# Patient Record
Sex: Male | Born: 1990 | Race: Black or African American | Hispanic: No | Marital: Single | Smoking: Current some day smoker
Health system: Southern US, Community
[De-identification: ages and names within clinical notes are randomized; demographics above are authoritative.]

---

## 2012-10-19 ENCOUNTER — Encounter (HOSPITAL_BASED_OUTPATIENT_CLINIC_OR_DEPARTMENT_OTHER): Payer: Self-pay

## 2012-10-19 ENCOUNTER — Emergency Department (HOSPITAL_BASED_OUTPATIENT_CLINIC_OR_DEPARTMENT_OTHER)
Admission: EM | Admit: 2012-10-19 | Discharge: 2012-10-19 | Disposition: A | Discharge status: Home / Self Care | Payer: Self-pay | Attending: Emergency Medicine | Admitting: Emergency Medicine

## 2012-10-19 DIAGNOSIS — L700 Acne vulgaris: Secondary | ICD-10-CM

## 2012-10-19 DIAGNOSIS — F17200 Nicotine dependence, unspecified, uncomplicated: Secondary | ICD-10-CM | POA: Insufficient documentation

## 2012-10-19 DIAGNOSIS — R220 Localized swelling, mass and lump, head: Secondary | ICD-10-CM | POA: Insufficient documentation

## 2012-10-19 DIAGNOSIS — L708 Other acne: Secondary | ICD-10-CM | POA: Insufficient documentation

## 2012-10-19 DIAGNOSIS — R21 Rash and other nonspecific skin eruption: Secondary | ICD-10-CM | POA: Insufficient documentation

## 2012-10-19 MED ORDER — SULFAMETHOXAZOLE-TRIMETHOPRIM 800-160 MG PO TABS: 1.0000 | ORAL_TABLET | Freq: Two times a day (BID) | ORAL | Status: AC

## 2012-10-19 MED ORDER — CEPHALEXIN 500 MG PO CAPS: 500.0000 mg | ORAL_CAPSULE | Freq: Four times a day (QID) | ORAL | Status: AC

## 2012-10-19 NOTE — ED Notes (Signed)
Multiple abscesses to face since march 1

## 2012-10-19 NOTE — ED Provider Notes (Signed)
History     CSN: [id number]  Arrival date & time 10/19/12  1826   First MD Initiated Contact with Patient 10/19/12 2104      Chief Complaint  Patient presents with  . Abscess    (Consider location/radiation/quality/duration/timing/severity/associated sxs/prior treatment) HPI Paul Andrews is a 22 y.o. male who presents to ED with complaint large "pimples" to the face. States history of acne, but never this large. Some are tender. NO drainage. Has tried clearasil with no improvment. States no fever, chills, malaise. Nothing making it better. No hx of the same. Symptoms began about a week ago. No new products to the face.    History reviewed. No pertinent past medical history.  History reviewed. No pertinent past surgical history.  No family history on file.  History  Substance Use Topics  . Smoking status: Current Every Day Smoker  . Smokeless tobacco: Not on file  . Alcohol Use: Yes      Review of Systems  Constitutional: Negative for fever and chills.  HENT: Positive for facial swelling.   Skin: Positive for rash and wound.    Allergies  Review of patient's allergies indicates no known allergies.  Home Medications  No current outpatient prescriptions on file.  BP 133/58  Pulse 55  Temp(Src) 98.2 F (36.8 C) (Oral)  Resp 16  Ht 6\' 1"  (1.854 m)  Wt 210 lb (95.255 kg)  BMI 27.71 kg/m2  SpO2 99%  Physical Exam  Vitals reviewed. Constitutional: He appears well-developed and well-nourished. No distress.  HENT:  Head: Normocephalic and atraumatic.  Multiple cystic acne vs furuncles to the bilateral face with largest one about 1cm in diameter to the right lower face. One in the left axilla, <1cm. All non tender. No drainage.   Neurological: He is alert.  Skin: Skin is warm and dry.    ED Course  Procedures (including critical care time)  Labs Reviewed - No data to display No results found.  INCISION AND DRAINAGE Performed by: Jaynie Crumble  A Consent: Verbal consent obtained. Risks and benefits: risks, benefits and alternatives were discussed Type: abscess  Body area: right lower cheek  Anesthesia: local infiltration  needdle aspiration  No anesthetic  Drainage: purulent  Drainage amount: moderate   Patient tolerance: Patient tolerated the procedure well with no immediate complications.     1. Cystic acne       MDM  Pt with multiple furuncles/ cystic acne. The large one to the right cheek aspirated with need with moderate purulent drainage. Will start on antibiotics. Warm compresses. Follow up with pcp.        Lottie Mussel, PA-C 10/20/12 0202

## 2012-10-21 NOTE — ED Provider Notes (Signed)
Medical screening examination/treatment/procedure(s) were performed by non-physician practitioner and as supervising physician I was immediately available for consultation/collaboration.  Hurman Horn, MD 10/21/12 2214

## 2015-09-22 ENCOUNTER — Emergency Department (HOSPITAL_COMMUNITY): Payer: BLUE CROSS/BLUE SHIELD

## 2015-09-22 ENCOUNTER — Encounter (HOSPITAL_COMMUNITY): Payer: Self-pay | Admitting: Emergency Medicine

## 2015-09-22 ENCOUNTER — Emergency Department (HOSPITAL_COMMUNITY)
Admission: EM | Admit: 2015-09-22 | Discharge: 2015-09-22 | Disposition: A | Discharge status: Home / Self Care | Payer: BLUE CROSS/BLUE SHIELD | Attending: Emergency Medicine | Admitting: Emergency Medicine

## 2015-09-22 DIAGNOSIS — Y9389 Activity, other specified: Secondary | ICD-10-CM | POA: Diagnosis not present

## 2015-09-22 DIAGNOSIS — Y990 Civilian activity done for income or pay: Secondary | ICD-10-CM | POA: Diagnosis not present

## 2015-09-22 DIAGNOSIS — W208XXA Other cause of strike by thrown, projected or falling object, initial encounter: Secondary | ICD-10-CM | POA: Insufficient documentation

## 2015-09-22 DIAGNOSIS — S92514A Nondisplaced fracture of proximal phalanx of right lesser toe(s), initial encounter for closed fracture: Secondary | ICD-10-CM | POA: Diagnosis not present

## 2015-09-22 DIAGNOSIS — S92901A Unspecified fracture of right foot, initial encounter for closed fracture: Secondary | ICD-10-CM

## 2015-09-22 DIAGNOSIS — Y9289 Other specified places as the place of occurrence of the external cause: Secondary | ICD-10-CM | POA: Insufficient documentation

## 2015-09-22 DIAGNOSIS — F17200 Nicotine dependence, unspecified, uncomplicated: Secondary | ICD-10-CM | POA: Diagnosis not present

## 2015-09-22 DIAGNOSIS — Z792 Long term (current) use of antibiotics: Secondary | ICD-10-CM | POA: Diagnosis not present

## 2015-09-22 DIAGNOSIS — S99921A Unspecified injury of right foot, initial encounter: Secondary | ICD-10-CM | POA: Diagnosis present

## 2015-09-22 MED ORDER — OXYCODONE-ACETAMINOPHEN 5-325 MG PO TABS
2.0000 | ORAL_TABLET | Freq: Once | ORAL | Status: AC
  Administered 2015-09-22: 2 via ORAL
  Filled 2015-09-22: qty 2

## 2015-09-22 MED ORDER — OXYCODONE-ACETAMINOPHEN 5-325 MG PO TABS: 1.0000 | ORAL_TABLET | Freq: Four times a day (QID) | ORAL | Status: AC | PRN

## 2015-09-22 NOTE — ED Notes (Signed)
See PA assessment 

## 2015-09-22 NOTE — ED Notes (Signed)
Pt reports that he was at work when a palate of water "crushed his foot". Pt alert x4. NAD at this time.

## 2015-09-22 NOTE — ED Provider Notes (Signed)
CSN: [id number]     Arrival date & time 09/22/15  1829 History  By signing my name below, I, Placido Sou, attest that this documentation has been prepared under the direction and in the presence of Melton Krebs PA-C. Electronically Signed: Placido Sou, ED Scribe. 09/22/2015. 7:17 PM.    Chief Complaint  Patient presents with  . Foot Injury   The history is provided by the patient. No language interpreter was used.    HPI Comments: Edem Tiegs is a 25 y.o. male who presents to the Emergency Department complaining of constant, moderate, right foot pain onset 2 hours ago. Pt notes a pallet of water was accidentally dropped on the affected foot while at work resulting in his current pain. His pain worsens with palpation or when bearing weight. Pt denies any other associated symptoms at this time.   History reviewed. No pertinent past medical history. History reviewed. No pertinent past surgical history. No family history on file. Social History  Substance Use Topics  . Smoking status: Current Some Day Smoker  . Smokeless tobacco: None  . Alcohol Use: No    Review of Systems A complete 10 system review of systems was obtained and all systems are negative except as noted in the HPI and PMH.    Allergies  Review of patient's allergies indicates no known allergies.  Home Medications   Prior to Admission medications   Medication Sig Start Date End Date Taking? Authorizing Provider  cephALEXin (KEFLEX) 500 MG capsule Take 1 capsule (500 mg total) by mouth 4 (four) times daily. 10/19/12   Tatyana Kirichenko, PA-C   BP 118/75 mmHg  Pulse 77  Temp(Src) 98.2 F (36.8 C) (Oral)  Resp 18  SpO2 100%    Physical Exam  Constitutional: He is oriented to person, place, and time. He appears well-developed and well-nourished.  HENT:  Head: Normocephalic and atraumatic.  Eyes: EOM are normal.  Neck: Normal range of motion.  Cardiovascular: Normal rate.    Pulmonary/Chest: Effort normal. No respiratory distress.  Abdominal: Soft.  Musculoskeletal: He exhibits edema and tenderness.  ROM limited due to pain on right foot. Normal ROM left foot.   Neurological: He is alert and oriented to person, place, and time.  Neurovascularly intact bilaterally.   Skin: Skin is warm and dry.  Psychiatric: He has a normal mood and affect.  Nursing note and vitals reviewed.   ED Course  Procedures  DIAGNOSTIC STUDIES: Oxygen Saturation is 100% on RA, normal by my interpretation.    COORDINATION OF CARE: 7:14 PM Discussed next steps with pt including DG of the right foot and reevaluation based on imaging results. He verbalized understanding and is agreeable with the plan.   Labs Review Labs Reviewed - No data to display  Imaging Review Dg Foot Complete Right  09/22/2015  CLINICAL DATA:  Foot caught under wheel of cart EXAM: RIGHT FOOT COMPLETE - 3+ VIEW COMPARISON:  None. FINDINGS: Frontal, oblique, and lateral views were obtained. There is soft tissue swelling over the dorsal mid and forefoot regions. There are nondisplaced obliquely oriented fractures along the medial proximal aspects of the second and third proximal phalanges. No other fractures are evident. No dislocation. Joint spaces appear intact. IMPRESSION: Nondisplaced obliquely oriented fractures along the medial aspects of the proximal second and third proximal phalanges. No other evidence of fracture. No dislocation. There is soft tissue swelling dorsally in this region. Electronically Signed   By: Bretta Bang III M.D.   On: 09/22/2015  19:37   I have personally reviewed and evaluated these images as part of my medical decision-making.  MDM   Final diagnoses:  Foot fracture, right, closed, initial encounter   Patient non-toxic appearing and VSS. Right foot xray demonstrates nondisplaced obliquely oriented fractures along the medial aspects of the proximal second and third proximal  phalanges. No other evidence of fracture. No dislocation. There is soft tissue swelling dorsally in this region.   Medications  oxyCODONE-acetaminophen (PERCOCET/ROXICET) 5-325 MG per tablet 2 tablet (2 tablets Oral Given 09/22/15 2009)   Patient feels improved after observation and/or treatment in ED.  Patient may be safely discharged home with percocet and ortho follow-up.  Discussed reasons for return. Patient in understanding and agreement with the plan.  I personally performed the services described in this documentation, which was scribed in my presence. The recorded information has been reviewed and is accurate.   Melton Krebs, PA-C 09/22/15 2144  Benjiman Core, MD 09/23/15 1550

## 2015-09-22 NOTE — Discharge Instructions (Signed)
Mr. Paul Andrews,  Nice meeting you! Please follow-up with orthopedics. Return to the emergency department if you develop increased pain, inability to walk, color changes in your toes/foot. Feel better soon!  S. Lane Hacker, PA-C

## 2017-01-25 IMAGING — CR DG FOOT COMPLETE 3+V*R*
3 series · 3 of 3 positions shown · non-contrast
Comparison: None.

CLINICAL DATA: Foot caught under wheel of cart

EXAM:
RIGHT FOOT COMPLETE - 3+ VIEW

[foot ap]
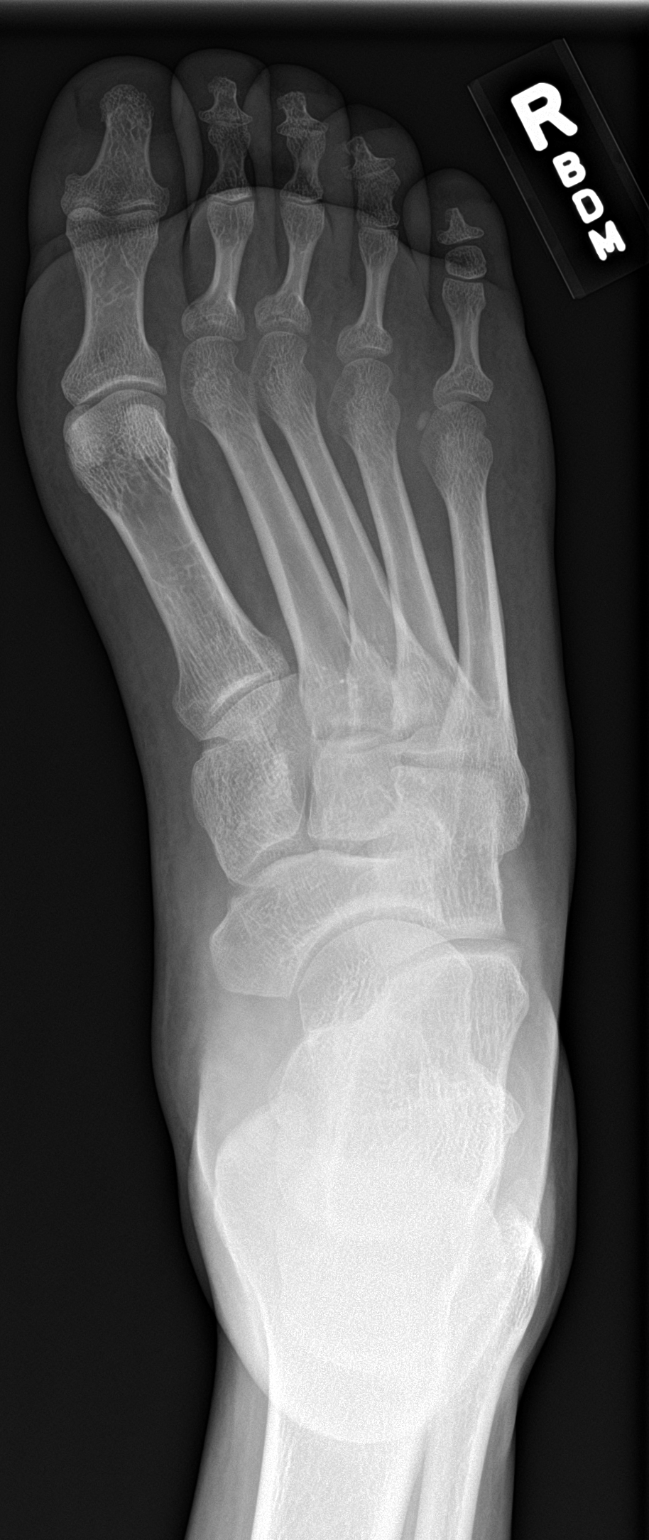

[foot obl]
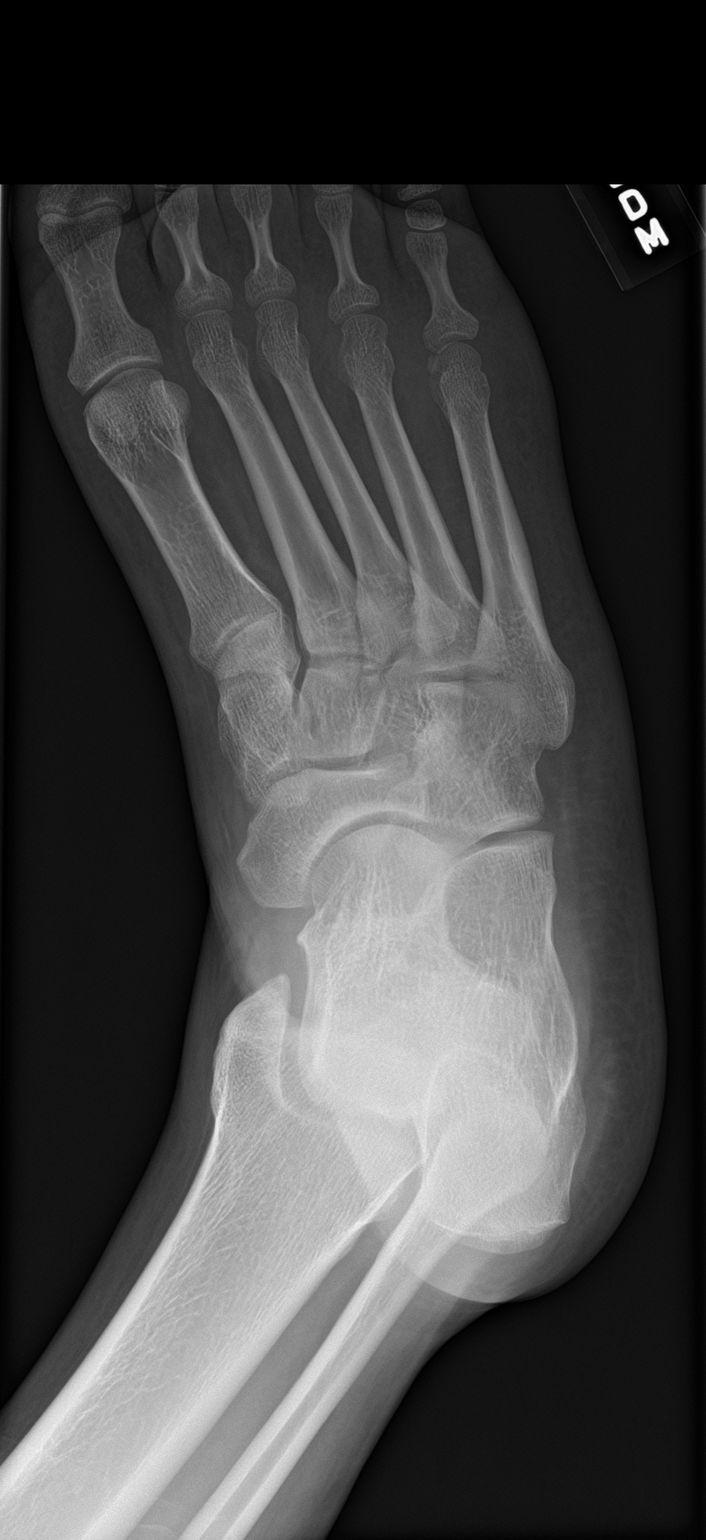

[foot lat]
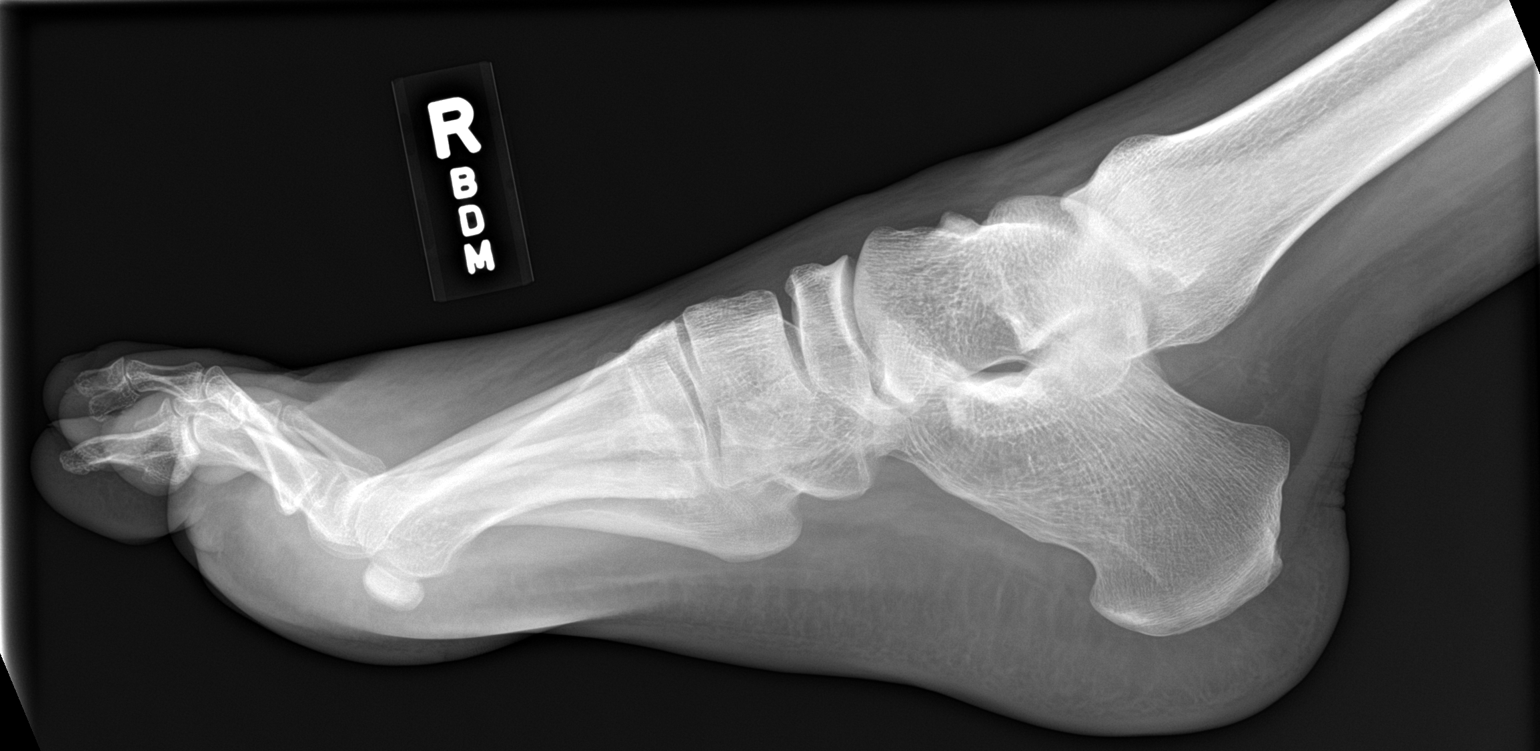

[3 of 3 positions shown; findings below may reference images not displayed]

FINDINGS: Frontal, oblique, and lateral views were obtained. There is soft
tissue swelling over the dorsal mid and forefoot regions. There are
nondisplaced obliquely oriented fractures along the medial proximal
aspects of the second and third proximal phalanges. No other
fractures are evident. No dislocation. Joint spaces appear intact.
IMPRESSION: Nondisplaced obliquely oriented fractures along the medial aspects
of the proximal second and third proximal phalanges. No other
evidence of fracture. No dislocation. There is soft tissue swelling
dorsally in this region.

## 2019-11-22 ENCOUNTER — Ambulatory Visit: Payer: Self-pay | Attending: Internal Medicine

## 2023-10-07 ENCOUNTER — Other Ambulatory Visit: Payer: Self-pay

## 2023-10-07 ENCOUNTER — Encounter (HOSPITAL_COMMUNITY): Payer: Self-pay | Admitting: Emergency Medicine

## 2023-10-07 ENCOUNTER — Emergency Department (HOSPITAL_COMMUNITY)
Admission: EM | Admit: 2023-10-07 | Discharge: 2023-10-07 | Discharge status: Against Medical Advice | Payer: Self-pay | Attending: Emergency Medicine | Admitting: Emergency Medicine

## 2023-10-07 ENCOUNTER — Emergency Department (HOSPITAL_COMMUNITY): Payer: BLUE CROSS/BLUE SHIELD

## 2023-10-07 DIAGNOSIS — R1011 Right upper quadrant pain: Secondary | ICD-10-CM | POA: Diagnosis not present

## 2023-10-07 DIAGNOSIS — R0781 Pleurodynia: Secondary | ICD-10-CM | POA: Insufficient documentation

## 2023-10-07 DIAGNOSIS — Z5321 Procedure and treatment not carried out due to patient leaving prior to being seen by health care provider: Secondary | ICD-10-CM | POA: Insufficient documentation

## 2023-10-07 NOTE — ED Triage Notes (Signed)
 Pt in with sharp pain to RUQ/R lower ribs, began a week ago and got worse tonight. Tender to touch, states he lifts heavy objects at work. No sob, cp or n/v reported

## 2023-10-07 NOTE — ED Notes (Signed)
 Pt called for vitals to be reassessed. No response

## 2024-08-17 ENCOUNTER — Other Ambulatory Visit: Payer: Self-pay

## 2024-08-17 ENCOUNTER — Emergency Department (HOSPITAL_BASED_OUTPATIENT_CLINIC_OR_DEPARTMENT_OTHER): Payer: Self-pay

## 2024-08-17 ENCOUNTER — Encounter (HOSPITAL_BASED_OUTPATIENT_CLINIC_OR_DEPARTMENT_OTHER): Payer: Self-pay | Admitting: Emergency Medicine

## 2024-08-17 DIAGNOSIS — Z79899 Other long term (current) drug therapy: Secondary | ICD-10-CM | POA: Insufficient documentation

## 2024-08-17 DIAGNOSIS — L03011 Cellulitis of right finger: Secondary | ICD-10-CM | POA: Insufficient documentation

## 2024-08-17 NOTE — ED Triage Notes (Signed)
 Pt c/o R thumb pain and swelling x 6 days. Denies known injury, purulent drainage, fever.   Took tylenol  appx 1 hr pta.

## 2024-08-18 ENCOUNTER — Emergency Department (HOSPITAL_BASED_OUTPATIENT_CLINIC_OR_DEPARTMENT_OTHER)
Admission: EM | Admit: 2024-08-18 | Discharge: 2024-08-18 | Disposition: A | Discharge status: Home / Self Care | Payer: Self-pay | Attending: Emergency Medicine | Admitting: Emergency Medicine

## 2024-08-18 DIAGNOSIS — L03011 Cellulitis of right finger: Secondary | ICD-10-CM

## 2024-08-18 MED ORDER — LIDOCAINE HCL 2 % IJ SOLN
10.0000 mL | Freq: Once | INTRAMUSCULAR | Status: AC
  Administered 2024-08-18: 200 mg
  Administered 2024-08-18: 10 mL
  Filled 2024-08-18: qty 20

## 2024-08-18 MED ORDER — ACETAMINOPHEN 325 MG PO TABS
650.0000 mg | ORAL_TABLET | Freq: Once | ORAL | Status: AC
  Administered 2024-08-18: 650 mg via ORAL
  Filled 2024-08-18: qty 2

## 2024-08-18 MED ORDER — IBUPROFEN 400 MG PO TABS
600.0000 mg | ORAL_TABLET | Freq: Once | ORAL | Status: AC
  Administered 2024-08-18: 600 mg via ORAL
  Filled 2024-08-18: qty 1

## 2024-08-18 NOTE — ED Provider Notes (Signed)
 " Paul Andrews Provider Note   CSN: [id number] Arrival date & time: 08/17/24  2236     Patient presents with: Finger Injury   Paul Andrews is a 34 y.o. male.   The history is provided by the patient.   He has noted painful swelling of his right thumb for the last week.  He denies any trauma.  He has not tried anything to treat it.  He has been unable to sleep because of pain.    Prior to Admission medications  Medication Sig Start Date End Date Taking? Authorizing Provider  cephALEXin  (KEFLEX ) 500 MG capsule Take 1 capsule (500 mg total) by mouth 4 (four) times daily. 10/19/12   Kirichenko, Tatyana, PA-C  oxyCODONE -acetaminophen  (PERCOCET/ROXICET) 5-325 MG tablet Take 1-2 tablets by mouth every 6 (six) hours as needed for severe pain. 09/22/15   Carlo Lucie Garre, PA-C    Allergies: Patient has no known allergies.    Review of Systems  All other systems reviewed and are negative.   Updated Vital Signs BP (!) 140/93 (BP Location: Right Arm)   Pulse 67   Temp 98.6 F (37 C)   Resp 18   Ht 6' 1 (1.854 m)   Wt 90.7 kg   SpO2 100%   BMI 26.39 kg/m   Physical Exam Vitals and nursing note reviewed.   34 year old male, resting comfortably and in no acute distress. Vital signs are significant for borderline elevated blood pressure. Oxygen saturation is 100%, which is normal. Head is normocephalic and atraumatic. PERRLA, EOMI.  Lungs are clear without rales, wheezes, or rhonchi. Heart has regular rate and rhythm without murmur. Extremities: Paronychia present right thumb. Skin is warm and dry without rash. Neurologic: Awake and alert, moves all extremities equally.   Radiology: DG Finger Thumb Right Result Date: 08/17/2024 EXAM: 3 VIEW(S) XRAY OF THE RIGHT FINGER(S) 08/17/2024 10:58:00 PM COMPARISON: None available. CLINICAL HISTORY: Swelling. FINDINGS: BONES AND JOINTS: No acute fracture. No malalignment. There are no  cortical erosions or periosteal reaction. SOFT TISSUES: Soft tissue swelling of the right thumb, most prominent along the dorsal aspect of the IP joint. There is no evidence for foreign body. IMPRESSION: 1. No acute osseous findings. 2. Soft tissue swelling of the right thumb, most prominent along the dorsal aspect of the interphalangeal joint. Electronically signed by: Greig Pique MD 08/17/2024 11:03 PM EST RP Workstation: [id number]     .Incision and Drainage: R thumb  Date/Time: 08/18/2024 2:38 AM  Performed by: Raford Lenis, MD Authorized by: Raford Lenis, MD   Consent:    Consent obtained:  Verbal   Consent given by:  Patient   Risks discussed:  Bleeding, incomplete drainage and pain   Alternatives discussed:  No treatment Universal protocol:    Procedure explained and questions answered to patient or proxy's satisfaction: yes     Relevant documents present and verified: yes     Test results available : yes     Imaging studies available: yes     Required blood products, implants, devices, and special equipment available: yes     Site/side marked: yes     Immediately prior to procedure, a time out was called: yes     Patient identity confirmed:  Verbally with patient and arm band Location:    Type:  Abscess   Location:  Upper extremity   Upper extremity location:  Finger   Finger location:  R thumb Pre-procedure details:  Skin preparation:  Antiseptic wash Sedation:    Sedation type:  None Anesthesia:    Anesthesia method:  Nerve block   Block location:  Right thumb   Block needle gauge:  25 G   Block anesthetic:  10 mL lidocaine  (XYLOCAINE ) 2 % (with presvative) injection   Block technique:  Digital block   Block injection procedure:  Anatomic landmarks identified, introduced needle, incremental injection, anatomic landmarks palpated and negative aspiration for blood   Block outcome:  Anesthesia achieved Procedure type:    Complexity:  Complex Procedure details:     Ultrasound guidance: no     Needle aspiration: no     Incision types:  Single straight   Incision depth:  Subcutaneous   Wound management:  Probed and deloculated   Drainage:  Purulent   Drainage amount:  Scant   Wound treatment:  Wound left open   Packing materials:  None Post-procedure details:    Procedure completion:  Tolerated well, no immediate complications    Medications Ordered in the ED  ibuprofen  (ADVIL ) tablet 600 mg (has no administration in time range)  acetaminophen  (TYLENOL ) tablet 650 mg (has no administration in time range)  lidocaine  (XYLOCAINE ) 2 % (with pres) injection 200 mg (200 mg Infiltration Given by Other 08/18/24 0247)                                    Medical Decision Making  Paronychia of the right thumb treated with incision and drainage.     Final diagnoses:  Paronychia of thumb, right    ED Discharge Orders     None          Raford Lenis, MD 08/18/24 [phone]  "

## 2024-08-18 NOTE — Discharge Instructions (Signed)
 Soak your thumb in warm water several times a day to help promote drainage.  You may take acetaminophen  and ibuprofen  as needed for pain.  Please note that if you combine acetaminophen  and ibuprofen , you will get better pain relief and to get from taking either medication by itself.
